# Patient Record
Sex: Male | Born: 1960 | Race: White | Hispanic: No | Marital: Single | Smoking: Current every day smoker
Health system: Southern US, Community
[De-identification: ages and names within clinical notes are randomized; demographics above are authoritative.]

## PROBLEM LIST (undated history)

## (undated) DIAGNOSIS — M5430 Sciatica, unspecified side: Secondary | ICD-10-CM

---

## 2011-03-12 ENCOUNTER — Emergency Department (INDEPENDENT_AMBULATORY_CARE_PROVIDER_SITE_OTHER)
Admission: EM | Admit: 2011-03-12 | Discharge: 2011-03-12 | Disposition: A | Discharge status: Home Health | Payer: Self-pay | Source: Home / Self Care | Attending: Emergency Medicine | Admitting: Emergency Medicine

## 2011-03-12 ENCOUNTER — Encounter (HOSPITAL_COMMUNITY): Payer: Self-pay | Admitting: *Deleted

## 2011-03-12 ENCOUNTER — Emergency Department (INDEPENDENT_AMBULATORY_CARE_PROVIDER_SITE_OTHER): Payer: Self-pay

## 2011-03-12 DIAGNOSIS — S2239XA Fracture of one rib, unspecified side, initial encounter for closed fracture: Secondary | ICD-10-CM

## 2011-03-12 DIAGNOSIS — Z23 Encounter for immunization: Secondary | ICD-10-CM

## 2011-03-12 MED ORDER — TETANUS-DIPHTH-ACELL PERTUSSIS 5-2.5-18.5 LF-MCG/0.5 IM SUSP
0.5000 mL | Freq: Once | INTRAMUSCULAR | Status: AC
  Administered 2011-03-12: 0.5 mL via INTRAMUSCULAR

## 2011-03-12 MED ORDER — KETOROLAC TROMETHAMINE 60 MG/2ML IM SOLN
60.0000 mg | Freq: Once | INTRAMUSCULAR | Status: AC
  Administered 2011-03-12: 60 mg via INTRAMUSCULAR

## 2011-03-12 MED ORDER — TRAMADOL HCL 50 MG PO TABS: 100.0000 mg | ORAL_TABLET | Freq: Three times a day (TID) | ORAL | Status: AC | PRN

## 2011-03-12 NOTE — ED Notes (Signed)
Pt  Reports  He  Felled      3  Days  Ago  And       inj  His  r  Side  He  Reports    Pain  When  He  cough   And  Takes  A  Deep breath      He is  Awake  Alert   Speaking  And  Complete    sentances     His  Skin is  Warm  /  Dry   Cap  Refill is  Brisk

## 2011-03-12 NOTE — Discharge Instructions (Signed)
Rib Fracture Your caregiver has diagnosed you as having a rib fracture (a break). This can occur by a blow to the chest, by a fall against a hard object, or by violent coughing or sneezing. There may be one or many breaks. Rib fractures may heal on their own within 3 to 8 weeks. The longer healing period is usually associated with a continued cough or other aggravating activities. HOME CARE INSTRUCTIONS   Avoid strenuous activity. Be careful during activities and avoid bumping the injured rib. Activities that cause pain pull on the fracture site(s) and are best avoided if possible.   Eat a normal, well-balanced diet. Drink plenty of fluids to avoid constipation.   Take deep breaths several times a day to keep lungs free of infection. Try to cough several times a day, splinting the injured area with a pillow. This will help prevent pneumonia.   Do not wear a rib belt or binder. These restrict breathing which can lead to pneumonia.   Only take over-the-counter or prescription medicines for pain, discomfort, or fever as directed by your caregiver.  SEEK MEDICAL CARE IF:  You develop a continual cough, associated with thick or bloody sputum. SEEK IMMEDIATE MEDICAL CARE IF:   You have a fever.   You have difficulty breathing.   You have nausea (feeling sick to your stomach), vomiting, or abdominal (belly) pain.   You have worsening pain, not controlled with medications.  Document Released: 01/07/2005 Document Revised: 09/19/2010 Document Reviewed: 06/11/2006 [employer] Patient Information 2012 [employer]. 

## 2011-03-12 NOTE — ED Provider Notes (Signed)
Chief Complaint  Patient presents with  . Pleurisy    History of Present Illness:   The patient is a 51 year old male who this past Saturday, 4 days ago, was unloading some hay from a truck when his leg slipped and he fell, striking his right rib cage against the truck bed. Ever since then he's had pain in the anterolateral right lower rib cage area which is worse with coughing, sneezing, inspiration, or movement. He denies any fever, chills, hemoptysis, or shortness of breath. He denies any abdominal pain, nausea, or vomiting. 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  Review of Systems:  Other than noted above, the patient denies any of the following symptoms. Systemic:  No fever, chills, sweats, or fatigue. ENT:  No nasal congestion, rhinorrhea, or sore throat. Pulmonary:  No cough, wheezing, shortness of breath, sputum production, hemoptysis. Cardiac:  No palpitations, rapid heartbeat, dizziness, presyncope or syncope. GI:  No abdominal pain, heartburn, nausea, or vomiting. Skin:  No rash or itching. Ext:  No leg pain or swelling.   PMFSH:  Past medical history, family history, social history, meds, and allergies were reviewed and updated as needed.  Physical Exam:   Vital signs:  BP 134/64  Pulse 70  Temp(Src) 97.9 F (36.6 C) (Oral)  Resp 16  SpO2 100% Gen:  Alert, oriented, in no distress, skin warm and dry. Eye:  PERRL, lids and conjunctivas normal.  Sclera non-icteric. ENT:  Mucous membranes moist, pharynx clear. Neck:  Supple, no adenopathy or tenderness.  No JVD. Lungs:  Clear to auscultation, no wheezes, rales or rhonchi.  No respiratory distress. Heart:  Regular rhythm.  No gallops, murmers, clicks or rubs. Chest:  There was tenderness to palpation in the right lower anterolateral, lateral, and posterior lateral chest area. No swelling, bruising, or deformity. Abdomen:  Soft, nontender, no organomegaly or mass.   Bowel sounds normal.  No pulsatile abdominal mass or bruit. Ext:  No edema.  No calf tenderness and Homann's sign negative.  Pulses full and equal. Skin:  Warm and dry.  No rash.  Labs:  No results found for this or any previous visit.   Radiology:  Dg Ribs Unilateral W/chest Right  03/12/2011  *RADIOLOGY REPORT*  Clinical Data: Larey Seat recently with right anterior and lateral chest pain, smoking history  RIGHT RIBS AND CHEST - 3+ VIEW  Comparison: None.  Findings: The lungs are clear and slightly hyperaerated. Mediastinal contours are normal.  Mild peribronchial thickening is present.  The heart is within normal limits in size.  Several right rib detail films were obtained.   There does appear to be a nondisplaced fracture involving the posterior right ninth rib.  IMPRESSION:  1.  Suspect nondisplaced fracture of the posterior right ninth rib. 2.  No active lung disease.  Original Report Authenticated By: Juline Patch, M.D.   Assessment:   Diagnoses that have been ruled out:  None  Diagnoses that are still under consideration:  None  Final diagnoses:  Rib fracture    Plan:   1.  The following meds were prescribed:   New Prescriptions   TRAMADOL (ULTRAM) 50 MG TABLET    Take 2 tablets (100 mg total) by mouth every 8 (eight) hours as needed for pain.   2.  The patient was instructed in symptomatic care and handouts were given. 3.  The patient was told to return if becoming worse in any way, if  no better in 3 or 4 days, and given some red flag symptoms that would indicate earlier return.    Roque Lias, MD 03/12/11 [id number]

## 2012-07-26 ENCOUNTER — Encounter (HOSPITAL_COMMUNITY): Payer: Self-pay | Admitting: Emergency Medicine

## 2012-07-26 ENCOUNTER — Emergency Department (INDEPENDENT_AMBULATORY_CARE_PROVIDER_SITE_OTHER): Payer: Self-pay

## 2012-07-26 ENCOUNTER — Emergency Department (INDEPENDENT_AMBULATORY_CARE_PROVIDER_SITE_OTHER)
Admission: EM | Admit: 2012-07-26 | Discharge: 2012-07-26 | Disposition: A | Discharge status: Home / Self Care | Payer: Self-pay | Source: Home / Self Care

## 2012-07-26 DIAGNOSIS — M5136 Other intervertebral disc degeneration, lumbar region: Secondary | ICD-10-CM

## 2012-07-26 DIAGNOSIS — M5430 Sciatica, unspecified side: Secondary | ICD-10-CM

## 2012-07-26 DIAGNOSIS — M5137 Other intervertebral disc degeneration, lumbosacral region: Secondary | ICD-10-CM

## 2012-07-26 MED ORDER — KETOROLAC TROMETHAMINE 60 MG/2ML IM SOLN
INTRAMUSCULAR | Status: AC
  Filled 2012-07-26: qty 2

## 2012-07-26 MED ORDER — KETOROLAC TROMETHAMINE 60 MG/2ML IM SOLN
60.0000 mg | Freq: Once | INTRAMUSCULAR | Status: AC
  Administered 2012-07-26: 60 mg via INTRAMUSCULAR

## 2012-07-26 MED ORDER — METHYLPREDNISOLONE ACETATE 40 MG/ML IJ SUSP
INTRAMUSCULAR | Status: AC
  Filled 2012-07-26: qty 5

## 2012-07-26 MED ORDER — METHOCARBAMOL 500 MG PO TABS: 500.0000 mg | ORAL_TABLET | Freq: Three times a day (TID) | ORAL | Status: DC | PRN

## 2012-07-26 MED ORDER — METHYLPREDNISOLONE ACETATE 40 MG/ML IJ SUSP
40.0000 mg | Freq: Once | INTRAMUSCULAR | Status: AC
  Administered 2012-07-26: 40 mg via INTRAMUSCULAR

## 2012-07-26 MED ORDER — METHYLPREDNISOLONE 4 MG PO KIT: PACK | ORAL | Status: DC

## 2012-07-26 NOTE — ED Provider Notes (Signed)
History    CSN: [id number] Arrival date & time 07/26/12  1224  First MD Initiated Contact with Patient 07/26/12 1307     Chief Complaint  Patient presents with  . Sciatica    pain in left leg. starts at hip all the way down leg. tingling sensation in toes.    (Consider location/radiation/quality/duration/timing/severity/associated sxs/prior Treatment) HPI  52 yo wm come in today with complaints of left sciatica.  States that yesterday he began having left buttock pain that radiates to his left calf.   Some numbness in his left foot.  Pain aggravated with ambulation and bending.  Symptoms are similar to what he had a couple of years ago but was on the right side.  Symptoms resolved with a prednisone taper.  Denies fever, chills, bowel/bladder changes.    History reviewed. No pertinent past medical history. History reviewed. No pertinent past surgical history. Family History  Problem Relation Age of Onset  . Coronary artery disease Father    History  Substance Use Topics  . Smoking status: Current Every Day Smoker -- 0.50 packs/day    Types: Cigarettes  . Smokeless tobacco: Not on file  . Alcohol Use: Yes    Review of Systems  Constitutional: Negative.   HENT: Negative.   Eyes: Negative.   Respiratory: Negative.   Cardiovascular: Negative.   Gastrointestinal: Negative.   Endocrine: Negative.   Genitourinary: Negative.   Musculoskeletal: Positive for back pain and gait problem.  Skin: Negative.   Neurological: Positive for numbness.  Psychiatric/Behavioral: Negative.     Allergies  Vicodin  Home Medications   Current Outpatient Rx  Name  Route  Sig  Dispense  Refill  . methocarbamol (ROBAXIN) 500 MG tablet   Oral   Take 1 tablet (500 mg total) by mouth every 8 (eight) hours as needed (spasms).   30 tablet   0   . methylPREDNISolone (MEDROL DOSEPAK) 4 MG tablet      6 day dose pack.  Take as directed.   21 tablet   0    BP 184/91  Pulse 62  Temp(Src) 98  F (36.7 C) (Oral)  Resp 18  SpO2 99% Physical Exam  Constitutional: He is oriented to person, place, and time. He appears well-developed and well-nourished.  HENT:  Head: Normocephalic and atraumatic.  Eyes: EOM are normal. Pupils are equal, round, and reactive to light.  Neck: Normal range of motion.  Pulmonary/Chest: Effort normal.  Musculoskeletal: Normal range of motion.  Gait antalgic.  Left sciatic notch tenderness.  Positive left straight leg raise.  Sensation intact.  No weakness.    Neurological: He is alert and oriented to person, place, and time.  Skin: Skin is warm and dry.  Psychiatric: He has a normal mood and affect.    ED Course  Procedures (including critical care time) Labs Reviewed - No data to display Dg Lumbar Spine 2-3 Views  07/26/2012   *RADIOLOGY REPORT*  Clinical Data: Sciatica  LUMBAR SPINE - 2-3 VIEW  Comparison: None.  Findings: Frontal lateral radiographs of the lumbosacral spine demonstrate no acute fracture or malalignment.  The vertebral body heights are maintained.  Transitional anatomy at L5 with partial sacralization bilaterally.  Mild multilevel degenerative disc disease without focality.  Normal bony mineralization. Unremarkable visualized abdominal bowel gas pattern.  IMPRESSION: Mild multilevel degenerative disc disease without focality.  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   Original Report Authenticated By: Malachy Moan, M.D.   1. DDD (degenerative  disc disease), lumbar   2. Acute sciatica     MDM  Patient will f/u with orthopedics in 1-2 weeks if symptoms not improved.  Give im depo and toradol injections today.  Will start medrol dose pack in a couple days if not better.  All questions answered.    Meds ordered this encounter  Medications  . methylPREDNISolone (MEDROL DOSEPAK) 4 MG tablet    Sig: 6 day dose pack.  Take as directed.    Dispense:  21 tablet    Refill:  0  . methocarbamol (ROBAXIN) 500 MG  tablet    Sig: Take 1 tablet (500 mg total) by mouth every 8 (eight) hours as needed (spasms).    Dispense:  30 tablet    Refill:  0  . methylPREDNISolone acetate (DEPO-MEDROL) injection 40 mg    Sig:   . ketorolac (TORADOL) injection 60 mg    Sig:     Zonia Kief, PA-C 07/26/12 [phone]

## 2012-07-26 NOTE — ED Notes (Signed)
Pt c/o sciatica pain in the left leg with tingling sensation in toes.  Acute on set. Denies injury.  Symptoms started yesterday.  Pt has used heating pad with mild relief.

## 2012-07-26 NOTE — ED Provider Notes (Signed)
Medical screening examination/treatment/procedure(s) were performed by non-physician practitioner and as supervising physician I was immediately available for consultation/collaboration.   MORENO-COLL,Novalyn Lajara; MD  Naol Ontiveros Moreno-Coll, MD 07/26/12 1925 

## 2014-09-18 IMAGING — CR DG LUMBAR SPINE 2-3V
2 series · 2 of 2 positions shown · non-contrast
Comparison: None.

CLINICAL DATA: Sciatica

LUMBAR SPINE - 2-3 VIEW

[view not recorded (1 of 2)]
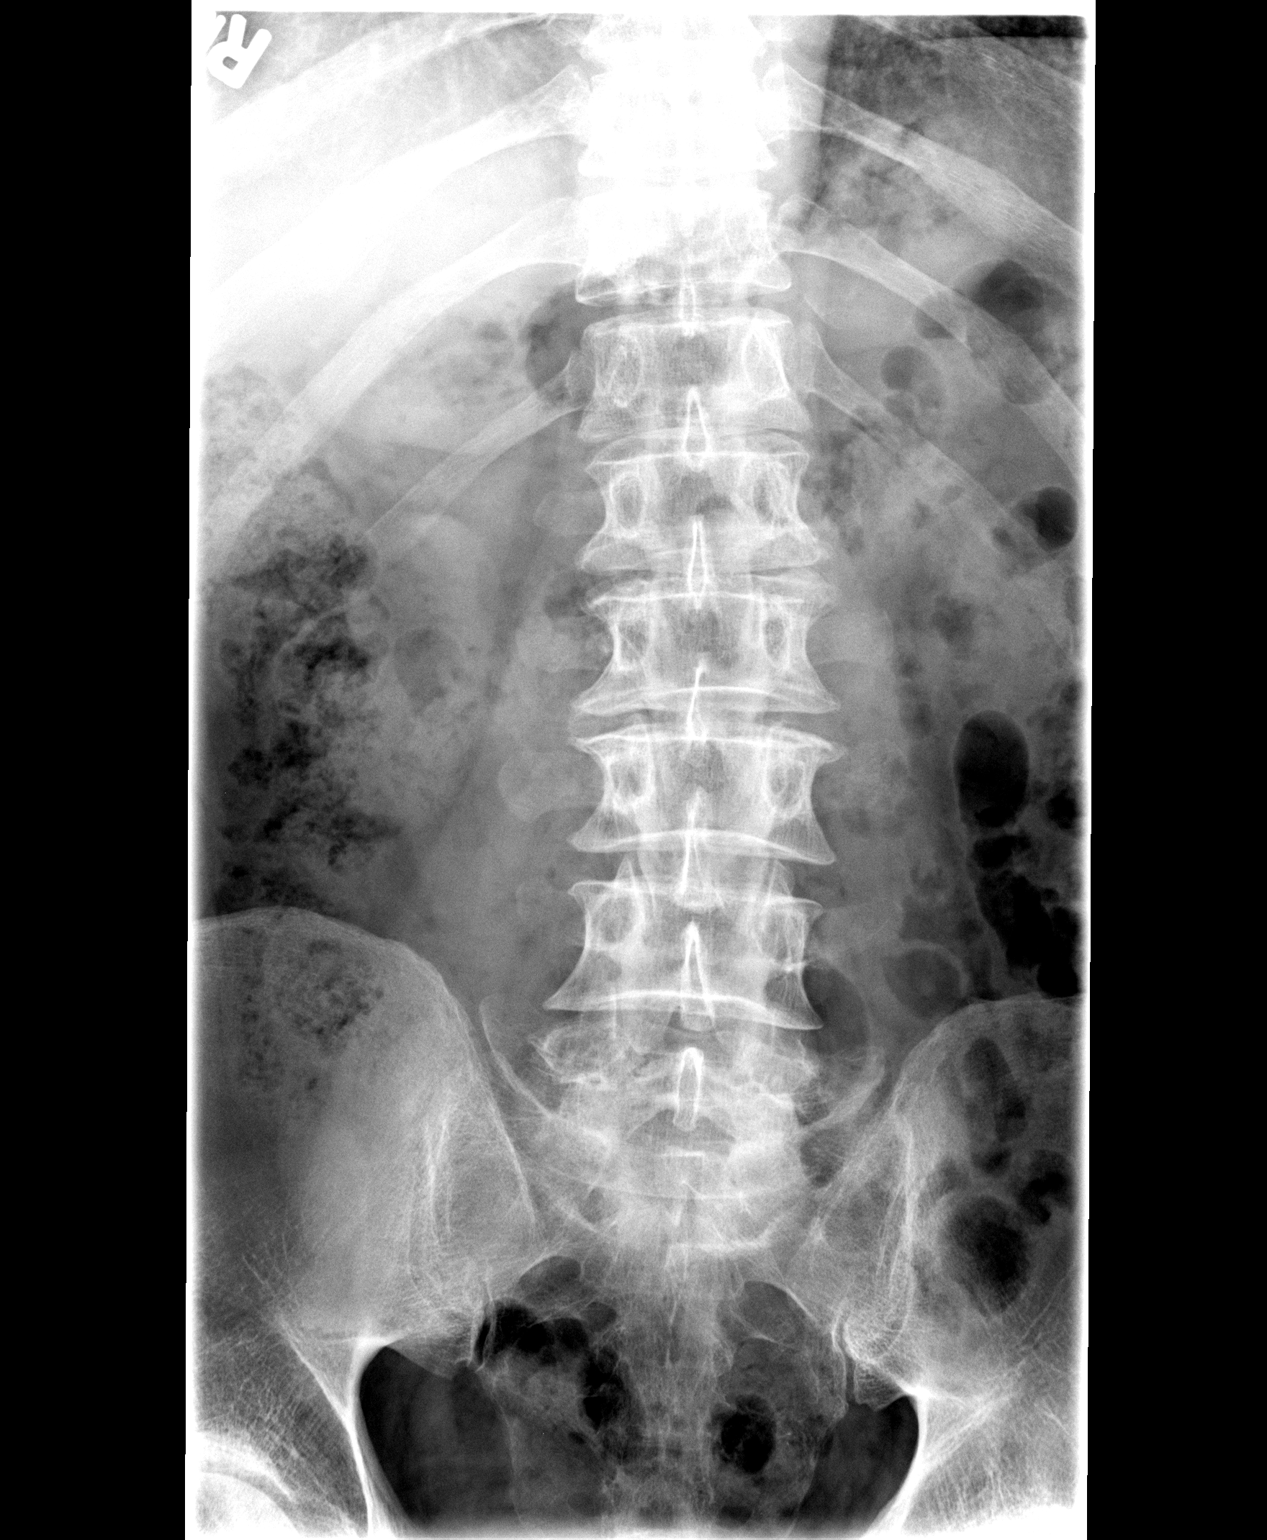

[view not recorded (2 of 2)]
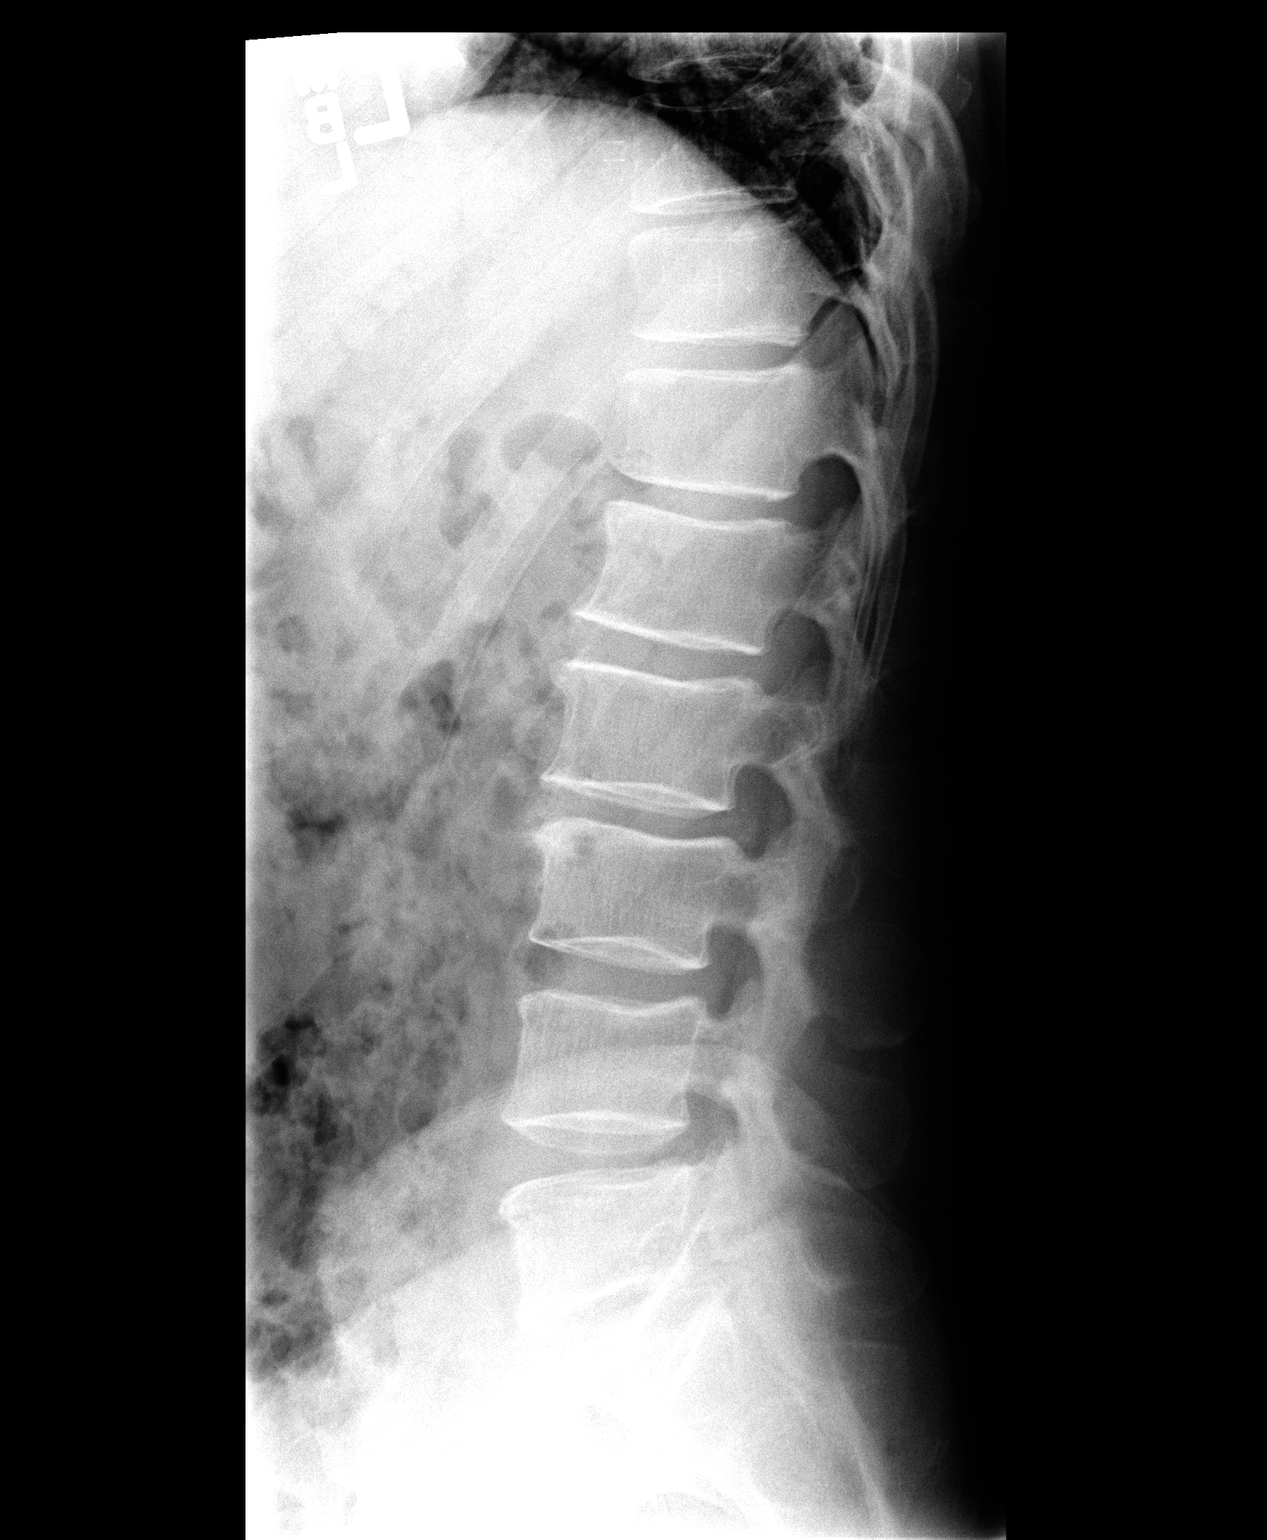

[2 of 2 positions shown; findings below may reference images not displayed]

FINDINGS: Frontal lateral radiographs of the lumbosacral spine
demonstrate no acute fracture or malalignment.  The vertebral body
heights are maintained.  Transitional anatomy at L5 with partial
sacralization bilaterally.  Mild multilevel degenerative disc
disease without focality.  Normal bony mineralization.
Unremarkable visualized abdominal bowel gas pattern.
IMPRESSION: Mild multilevel degenerative disc disease without focality.

Transitional anatomy at L5 with bilateral partial sacralization.

## 2015-04-28 ENCOUNTER — Ambulatory Visit (HOSPITAL_COMMUNITY)
Admission: EM | Admit: 2015-04-28 | Discharge: 2015-04-28 | Disposition: A | Discharge status: Home / Self Care | Payer: Self-pay | Attending: Emergency Medicine | Admitting: Emergency Medicine

## 2015-04-28 ENCOUNTER — Encounter (HOSPITAL_COMMUNITY): Payer: Self-pay | Admitting: Emergency Medicine

## 2015-04-28 DIAGNOSIS — M5432 Sciatica, left side: Secondary | ICD-10-CM

## 2015-04-28 HISTORY — DX: Sciatica, unspecified side: M54.30

## 2015-04-28 MED ORDER — METHYLPREDNISOLONE 4 MG PO TBPK: ORAL_TABLET | ORAL | Status: AC

## 2015-04-28 MED ORDER — METHOCARBAMOL 500 MG PO TABS: 500.0000 mg | ORAL_TABLET | Freq: Two times a day (BID) | ORAL | Status: AC

## 2015-04-28 NOTE — ED Notes (Signed)
PT has history of sciatic nerve pain. PT reports it has been bothering him for a month, but the last week has been very painful. PT reports pain is in left leg.

## 2015-04-28 NOTE — Discharge Instructions (Signed)

## 2015-04-28 NOTE — ED Provider Notes (Signed)
CSN: [id number]     Arrival date & time 04/28/15  1255 History   First MD Initiated Contact with Patient 04/28/15 1346     Chief Complaint  Patient presents with  . Sciatica   (Consider location/radiation/quality/duration/timing/severity/associated sxs/prior Treatment) HPI Comments: 55-year-old male with a history of degenerative disc disease of the lumbar spine associated with sciatica. His last episode was in 2014. He was treated with steroids and Robaxin. The pain abated without treatment and is had no problems since. In the past 2-3 weeks he has had pain primarily in the left leg following an L4-L5 dermatome distribution. He states the pain is sometimes worse with just ambulation and prolonged standing sometimes better the squats. The pain feels as though it is deep within the leg.   Past Medical History  Diagnosis Date  . Sciatica    History reviewed. No pertinent past surgical history. Family History  Problem Relation Age of Onset  . Coronary artery disease Father    Social History  Substance Use Topics  . Smoking status: Current Every Day Smoker -- 0.50 packs/day    Types: Cigarettes  . Smokeless tobacco: None  . Alcohol Use: Yes    Review of Systems  Constitutional: Positive for activity change. Negative for fever and fatigue.  HENT: Negative.   Respiratory: Negative.   Cardiovascular: Negative.   Gastrointestinal: Negative.   Genitourinary: Negative.   Musculoskeletal:       Denies actual muscle pain.  Skin: Negative.   Neurological: Negative for numbness.    Allergies  Review of patient's allergies indicates no known allergies.  Home Medications   Prior to Admission medications   Medication Sig Start Date End Date Taking? Authorizing Provider  omeprazole (PRILOSEC) 20 MG capsule Take 20 mg by mouth daily.   Yes Historical Provider, MD  methocarbamol (ROBAXIN) 500 MG tablet Take 1 tablet (500 mg total) by mouth 2 (two) times daily. Prn muscle spasm. May cause  drowsiness 04/28/15   Hayden Rasmussen, NP  methylPREDNISolone (MEDROL DOSEPAK) 4 MG TBPK tablet follow package directions 04/28/15   Hayden Rasmussen, NP   Meds Ordered and Administered this Visit  Medications - No data to display  BP 162/101 mmHg  Pulse 82  Temp(Src) 97.7 F (36.5 C) (Oral)  Ht  (1.778 m)  Wt 180 lb (81.647 kg)  BMI 25.83 kg/m2  SpO2 99% No data found.   Physical Exam  Constitutional: He is oriented to person, place, and time. He appears well-developed and well-nourished. No distress.  Neck: Normal range of motion. Neck supple.  Cardiovascular: Normal rate and regular rhythm.   Pulmonary/Chest: Effort normal. No respiratory distress.  Musculoskeletal: Normal range of motion. He exhibits no edema.  Normal range of motion of the left lower extremity. No muscle tenderness. Strength is 4/5 on the left, 5 over 5 on the right.  Neurological: He is alert and oriented to person, place, and time.  Skin: Skin is warm and dry.  Psychiatric: He has a normal mood and affect.  Nursing note and vitals reviewed.   ED Course  Procedures (including critical care time)  Labs Review Labs Reviewed - No data to display  Imaging Review No results found.   Visual Acuity Review  Right Eye Distance:   Left Eye Distance:   Bilateral Distance:    Right Eye Near:   Left Eye Near:    Bilateral Near:         MDM   1. Sciatica associated with disorder of  lumbar spine, left    Meds ordered this encounter  Medications  . omeprazole (PRILOSEC) 20 MG capsule    Sig: Take 20 mg by mouth daily.  . methylPREDNISolone (MEDROL DOSEPAK) 4 MG TBPK tablet    Sig: follow package directions    Dispense:  21 tablet    Refill:  0    Order Specific Question:  Supervising Provider    Answer:  Charm RingsHONIG, ERIN J [id number]  . methocarbamol (ROBAXIN) 500 MG tablet    Sig: Take 1 tablet (500 mg total) by mouth 2 (two) times daily. Prn muscle spasm. May cause drowsiness    Dispense:  20 tablet     Refill:  0    Order Specific Question:  Supervising Provider    Answer:  Charm RingsHONIG, ERIN J [id number]]       Hayden Rasmussenavid Newton Frutiger, NP 04/28/15 1402
